# Patient Record
Sex: Male | Born: 1990 | Race: White | Hispanic: No | Marital: Single | Smoking: Current every day smoker
Health system: Southern US, Community
[De-identification: ages and names within clinical notes are randomized; demographics above are authoritative.]

---

## 2017-06-04 ENCOUNTER — Emergency Department (HOSPITAL_COMMUNITY)
Admission: EM | Admit: 2017-06-04 | Discharge: 2017-06-04 | Disposition: A | Discharge status: Home / Self Care | Payer: Self-pay | Attending: Emergency Medicine | Admitting: Emergency Medicine

## 2017-06-04 ENCOUNTER — Other Ambulatory Visit: Payer: Self-pay

## 2017-06-04 ENCOUNTER — Encounter (HOSPITAL_COMMUNITY): Payer: Self-pay | Admitting: Emergency Medicine

## 2017-06-04 DIAGNOSIS — Y999 Unspecified external cause status: Secondary | ICD-10-CM | POA: Insufficient documentation

## 2017-06-04 DIAGNOSIS — W2209XA Striking against other stationary object, initial encounter: Secondary | ICD-10-CM | POA: Insufficient documentation

## 2017-06-04 DIAGNOSIS — S61216A Laceration without foreign body of right little finger without damage to nail, initial encounter: Secondary | ICD-10-CM | POA: Insufficient documentation

## 2017-06-04 DIAGNOSIS — Z23 Encounter for immunization: Secondary | ICD-10-CM | POA: Insufficient documentation

## 2017-06-04 DIAGNOSIS — Y939 Activity, unspecified: Secondary | ICD-10-CM | POA: Insufficient documentation

## 2017-06-04 DIAGNOSIS — Y929 Unspecified place or not applicable: Secondary | ICD-10-CM | POA: Insufficient documentation

## 2017-06-04 MED ORDER — LIDOCAINE HCL (PF) 1 % IJ SOLN
5.0000 mL | Freq: Once | INTRAMUSCULAR | Status: AC
  Administered 2017-06-04: 5 mL via INTRADERMAL
  Filled 2017-06-04: qty 30

## 2017-06-04 MED ORDER — AMOXICILLIN-POT CLAVULANATE 875-125 MG PO TABS: 1.0000 | ORAL_TABLET | Freq: Two times a day (BID) | ORAL | 0 refills | Status: DC

## 2017-06-04 MED ORDER — TETANUS-DIPHTH-ACELL PERTUSSIS 5-2.5-18.5 LF-MCG/0.5 IM SUSP
0.5000 mL | Freq: Once | INTRAMUSCULAR | Status: AC
  Administered 2017-06-04: 0.5 mL via INTRAMUSCULAR
  Filled 2017-06-04: qty 0.5

## 2017-06-04 NOTE — ED Notes (Signed)
Suture cart at bedside 

## 2017-06-04 NOTE — Discharge Instructions (Addendum)
Return for any new or worsening symptoms. If sutures appear infected or are still present after 14 days they may need to be removed.

## 2017-06-04 NOTE — ED Triage Notes (Signed)
Pt presents with laceration to right pinky knuckle post hitting room.

## 2017-06-04 NOTE — ED Provider Notes (Signed)
[facility]-EMERGENCY DEPT Provider Note   CSN: [id number] Arrival date & time: 06/04/17  0919     History   Chief Complaint Chief Complaint  Patient presents with  . Finger Injury    HPI Daniel Farmer is a 27 y.o. male presents the emergency department with chief complaint of laceration.  Patient states that he had a wall this morning and has a laceration over the fifth right MCP joint.  He denies ulcer Acac[place] or potential for "fight bite."  He has no numbness or tingling and full range of motion of the hand.  Patient is unaware of his last tetanus vaccination but states he thinks that he may have had one after he was seen as a trauma at [facility] about 2 years ago.  Review of EMR shows patient did not receive tetanus vaccination and will be updated today.  HPI  History reviewed. No pertinent past medical history.  There are no active problems to display for this patient.   History reviewed. No pertinent surgical history.      Home Medications    Prior to Admission medications   Medication Sig Start Date End Date Taking? Authorizing Provider  amoxicillin-clavulanate (AUGMENTIN) 875-125 MG tablet Take 1 tablet by mouth 2 (two) times daily. One po bid x 7 days 06/04/17   Arthor Captain, PA-C    Family History No family history on file.  Social History Social History   Tobacco Use  . Smoking status: Not on file  Substance Use Topics  . Alcohol use: Not on file  . Drug use: Not on file     Allergies   Patient has no known allergies.   Review of Systems Review of Systems Ten systems reviewed and are negative for acute change, except as noted in the HPI.    Physical Exam Updated Vital Signs BP 121/72 (BP Location: Left Arm)   Pulse (!) 55   Temp 97.7 F (36.5 C) (Oral)   Resp 18   SpO2 100%   Physical Exam  Constitutional: He appears well-developed and well-nourished. No distress.  HENT:  Head: Normocephalic and atraumatic.    Eyes: Pupils are equal, round, and reactive to light. Conjunctivae and EOM are normal. No scleral icterus.  Neck: Normal range of motion. Neck supple.  Cardiovascular: Normal rate, regular rhythm and normal heart sounds.  Pulmonary/Chest: Effort normal and breath sounds normal. No respiratory distress.  Abdominal: Soft. There is no tenderness.  Musculoskeletal: He exhibits no edema.  1 cm laceration over the R 5th MCP   Neurological: He is alert.  Skin: Skin is warm and dry. He is not diaphoretic.  Psychiatric: His behavior is normal.  Nursing note and vitals reviewed.    ED Treatments / Results  Labs (all labs ordered are listed, but only abnormal results are displayed) Labs Reviewed - No data to display  EKG None  Radiology No results found.  Procedures Procedures (including critical care time) LACERATION REPAIR Performed by: Arthor Captain Authorized by: Arthor Captain Consent: Verbal consent obtained. Risks and benefits: risks, benefits and alternatives were discussed Consent given by: patient Patient identity confirmed: provided demographic data Prepped and Draped in normal sterile fashion Wound explored  Laceration Location: Right fifth MCP joint  Laceration Length: 1cm  No Foreign Bodies seen or palpated  Anesthesia: local infiltration  Local anesthetic: lidocaine 1% W/O epinephrine  Anesthetic total: 3 ml  Irrigation method: syringe Amount of cleaning: standard  Skin closure: 5.0 Vicryl  Number of  sutures: 3.0  Technique: Simple interrupted  Patient tolerance: Patient tolerated the procedure well with no immediate complications.  Medications Ordered in ED Medications  lidocaine (PF) (XYLOCAINE) 1 % injection 5 mL (5 mLs Intradermal Given 06/04/17 1053)  Tdap (BOOSTRIX) injection 0.5 mL (0.5 mLs Intramuscular Given 06/04/17 1140)     Initial Impression / Assessment and Plan / ED Course  I have reviewed the triage vital signs and the nursing  notes.  Pertinent labs & imaging results that were available during my care of the patient were reviewed by me and considered in my medical decision making (see chart for details).     Daniel Farmer is a 27 y.o. male who presents to ED for laceration of right hand. Wound thoroughly cleaned in ED today. Wound explored and bottom of wound seen in a bloodless field. Laceration repaired as dictated above. Patient counseled on home wound care.  Patient was urged to return to the Emergency Department for worsening pain, swelling, expanding erythema especially if it streaks away from the affected area, fever, or for any additional concerns. Patient verbalized understanding. All questions answered.   Final Clinical Impressions(s) / ED Diagnoses   Final diagnoses:  Laceration of right little finger without foreign body without damage to nail, initial encounter    ED Discharge Orders        Ordered    amoxicillin-clavulanate (AUGMENTIN) 875-125 MG tablet  2 times daily     06/04/17 1116       Arthor CaptainHarris, Damien Cisar, PA-C 06/04/17 1623    Azalia Bilisampos, Kevin, MD 06/05/17 1312

## 2018-10-22 ENCOUNTER — Emergency Department (HOSPITAL_COMMUNITY): Payer: Self-pay

## 2018-10-22 ENCOUNTER — Other Ambulatory Visit: Payer: Self-pay

## 2018-10-22 ENCOUNTER — Emergency Department (HOSPITAL_COMMUNITY)
Admission: EM | Admit: 2018-10-22 | Discharge: 2018-10-22 | Disposition: A | Discharge status: Home / Self Care | Payer: Self-pay | Attending: Emergency Medicine | Admitting: Emergency Medicine

## 2018-10-22 DIAGNOSIS — Z23 Encounter for immunization: Secondary | ICD-10-CM | POA: Insufficient documentation

## 2018-10-22 DIAGNOSIS — Y9389 Activity, other specified: Secondary | ICD-10-CM | POA: Insufficient documentation

## 2018-10-22 DIAGNOSIS — S61213A Laceration without foreign body of left middle finger without damage to nail, initial encounter: Secondary | ICD-10-CM | POA: Insufficient documentation

## 2018-10-22 DIAGNOSIS — S61412A Laceration without foreign body of left hand, initial encounter: Secondary | ICD-10-CM | POA: Insufficient documentation

## 2018-10-22 DIAGNOSIS — Y990 Civilian activity done for income or pay: Secondary | ICD-10-CM | POA: Insufficient documentation

## 2018-10-22 DIAGNOSIS — S6992XA Unspecified injury of left wrist, hand and finger(s), initial encounter: Secondary | ICD-10-CM

## 2018-10-22 DIAGNOSIS — W230XXA Caught, crushed, jammed, or pinched between moving objects, initial encounter: Secondary | ICD-10-CM | POA: Insufficient documentation

## 2018-10-22 DIAGNOSIS — Y9259 Other trade areas as the place of occurrence of the external cause: Secondary | ICD-10-CM | POA: Insufficient documentation

## 2018-10-22 LAB — COMPREHENSIVE METABOLIC PANEL
ALT: 21 U/L (ref 0–44)
AST: 27 U/L (ref 15–41)
Albumin: 4.4 g/dL (ref 3.5–5.0)
Alkaline Phosphatase: 48 U/L (ref 38–126)
Anion gap: 10 (ref 5–15)
BUN: 13 mg/dL (ref 6–20)
CO2: 26 mmol/L (ref 22–32)
Calcium: 9.7 mg/dL (ref 8.9–10.3)
Chloride: 102 mmol/L (ref 98–111)
Creatinine, Ser: 1.15 mg/dL (ref 0.61–1.24)
GFR calc Af Amer: >60 mL/min (ref >60)
GFR calc non Af Amer: >60 mL/min (ref >60)
Glucose, Bld: 102 mg/dL — ABNORMAL HIGH (ref 70–99)
Potassium: 4.2 mmol/L (ref 3.5–5.1)
Sodium: 138 mmol/L (ref 135–145)
Total Bilirubin: 1 mg/dL (ref 0.3–1.2)
Total Protein: 7.5 g/dL (ref 6.5–8.1)

## 2018-10-22 LAB — HEPATITIS B SURFACE ANTIGEN: Hepatitis B Surface Ag: NONREACTIVE

## 2018-10-22 LAB — HEPATITIS C ANTIBODY: HCV Ab: NONREACTIVE

## 2018-10-22 MED ORDER — ONDANSETRON HCL 4 MG/2ML IJ SOLN
4.0000 mg | Freq: Once | INTRAMUSCULAR | Status: AC
  Administered 2018-10-22: 20:00:00 4 mg via INTRAVENOUS
  Filled 2018-10-22: qty 2

## 2018-10-22 MED ORDER — IBUPROFEN 800 MG PO TABS: 800.0000 mg | ORAL_TABLET | Freq: Three times a day (TID) | ORAL | 0 refills | Status: DC

## 2018-10-22 MED ORDER — MORPHINE SULFATE (PF) 4 MG/ML IV SOLN
4.0000 mg | Freq: Once | INTRAVENOUS | Status: AC
  Administered 2018-10-22: 4 mg via INTRAVENOUS
  Filled 2018-10-22: qty 1

## 2018-10-22 MED ORDER — MORPHINE SULFATE (PF) 4 MG/ML IV SOLN
4.0000 mg | Freq: Once | INTRAVENOUS | Status: AC
  Administered 2018-10-22: 20:00:00 4 mg via INTRAVENOUS
  Filled 2018-10-22: qty 1

## 2018-10-22 MED ORDER — TETANUS-DIPHTH-ACELL PERTUSSIS 5-2.5-18.5 LF-MCG/0.5 IM SUSP
0.5000 mL | Freq: Once | INTRAMUSCULAR | Status: AC
Start: 2018-10-22 — End: 2018-10-22
  Administered 2018-10-22: 16:00:00 0.5 mL via INTRAMUSCULAR
  Filled 2018-10-22: qty 0.5

## 2018-10-22 MED ORDER — LIDOCAINE HCL (PF) 1 % IJ SOLN
5.0000 mL | Freq: Once | INTRAMUSCULAR | Status: AC
  Administered 2018-10-22: 16:00:00 5 mL
  Filled 2018-10-22: qty 5

## 2018-10-22 MED ORDER — OXYCODONE-ACETAMINOPHEN 5-325 MG PO TABS: 2.0000 | ORAL_TABLET | ORAL | 0 refills | Status: DC | PRN

## 2018-10-22 MED ORDER — LIDOCAINE HCL (PF) 1 % IJ SOLN
10.0000 mL | Freq: Once | INTRAMUSCULAR | Status: AC
  Administered 2018-10-22: 10 mL
  Filled 2018-10-22: qty 10

## 2018-10-22 MED ORDER — ONDANSETRON HCL 4 MG/2ML IJ SOLN
4.0000 mg | Freq: Once | INTRAMUSCULAR | Status: AC
  Administered 2018-10-22: 4 mg via INTRAVENOUS
  Filled 2018-10-22: qty 2

## 2018-10-22 NOTE — ED Provider Notes (Signed)
[facility] EMERGENCY DEPARTMENT Provider Note   CSN: [id number] Arrival date & time: 10/22/18  1559     History   Chief Complaint Chief Complaint  Patient presents with   Hand Injury    HPI Daniel Farmer is a 28 y.o. 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  He complains of significant pain and nausea.  Patient is able to move his fingers and he denies any and all other symptoms.  He is unsure of his tetanus status.       HPI  No past medical history on file.  There are no active problems to display for this patient.   No past surgical history on file.      Home Medications    Prior to Admission medications   Medication Sig Start Date End Date Taking? Authorizing Provider  ibuprofen (ADVIL) 800 MG tablet Take 1 tablet (800 mg total) by mouth 3 (three) times daily. 10/22/18   Corena Herter, PA-C  oxyCODONE-acetaminophen (PERCOCET/ROXICET) 5-325 MG tablet Take 2 tablets by mouth every 4 (four) hours as needed for severe pain. 10/22/18   Corena Herter, PA-C    Family History No family history on file.  Social History Social History   Tobacco Use   Smoking status: Not on file  Substance Use Topics   Alcohol use: Not on file   Drug use: Not on file     Allergies   Patient has no known allergies.   Review of Systems Review of Systems  All other systems reviewed and are negative.    Physical Exam Updated Vital Signs BP 130/70    Pulse (!) 57    Resp 12    Ht 6' (1.829 m)    Wt 79.4 kg    SpO2 100%    BMI 23.73 kg/m   Physical Exam Vitals signs and nursing note reviewed. Exam conducted with a chaperone present.  Constitutional:      Appearance: Normal appearance.  HENT:     Head: Normocephalic and atraumatic.  Eyes:     General: No scleral icterus.    Conjunctiva/sclera: Conjunctivae  normal.  Pulmonary:     Effort: Pulmonary effort is normal.  Musculoskeletal:     Comments: Left hand: 4 cm jagged laceration to palm.  Hemostasis achieved with compression.  V shaped laceration palmar aspect third finger immediately distal to MCP joint.  Radial pulse intact.  Cap refill intact.  Patient is able to extend and flex fingers, albeit third finger is limited due to pain and inflammation.  Sensation intact throughout. Left wrist: Range of motion, strength, and sensation intact.  Skin:    General: Skin is dry.  Neurological:     Mental Status: He is alert.     GCS: GCS eye subscore is 4. GCS verbal subscore is 5. GCS motor subscore is 6.  Psychiatric:        Mood and Affect: Mood normal.        Behavior: Behavior normal.        Thought Content: Thought content normal.      ED Treatments / Results  Labs (all labs ordered are listed, but only abnormal results are displayed) Labs Reviewed  RAPID HIV SCREEN (HIV 1/2 AB+AG)  COMPREHENSIVE METABOLIC PANEL  HEPATITIS C ANTIBODY  HEPATITIS B SURFACE ANTIGEN  RPR    EKG  None  Radiology Dg Hand Complete Left  Result Date: 10/22/2018 CLINICAL DATA:  LEFT hand crushed between a biopsy CT and a dump truck, lacerations to palm, LEFT hand pain running up ulnar dorsal sides across PIP joints into index through little fingers EXAM: LEFT HAND - COMPLETE 3+ VIEW COMPARISON:  None FINDINGS: Osseous mineralization normal. Joint spaces preserved. Questionable nondisplaced fracture transversely at head of third metacarpal versus superimposed soft tissue artifact. Multiple small radiopacities project at the soft tissues palmar soft tissues at the level of the third metacarpal head. Laceration at the middle finger. Subtle lucency at base of distal phalanx LEFT index finger, cannot exclude nondisplaced intra-articular fracture. No additional fracture, dislocation, or bone destruction. IMPRESSION: Question nondisplaced intra-articular fracture at  base of distal phalanx LEFT index finger. Transverse nondisplaced fracture versus artifact at the third metacarpal head. Radiopaque foreign bodies at volar soft tissues at the level of the distal third metacarpal. Electronically Signed   By: Ulyses SouthwardMark  Boles M.D.   On: 10/22/2018 17:13    Procedures .Marland Kitchen.Laceration Repair  Date/Time: 10/22/2018 7:02 PM Performed by: Lorelee NewGreen, Lisl Slingerland L, PA-C Authorized by: Lorelee NewGreen, Shawnya Mayor L, PA-C   Consent:    Consent obtained:  Verbal   Consent given by:  Patient   Risks discussed:  Infection, pain, retained foreign body, vascular damage, tendon damage, poor cosmetic result, poor wound healing, nerve damage and need for additional repair   Alternatives discussed:  No treatment Anesthesia (see MAR for exact dosages):    Anesthesia method:  Local infiltration   Local anesthetic:  Lidocaine 1% w/o epi Laceration details:    Location:  Hand   Hand location:  L palm   Length (cm):  3 Repair type:    Repair type:  Simple Pre-procedure details:    Preparation:  Patient was prepped and draped in usual sterile fashion and imaging obtained to evaluate for foreign bodies Treatment:    Area cleansed with:  Saline and soap and water   Amount of cleaning:  Extensive   Irrigation solution:  Sterile saline   Irrigation volume:  500 mL   Irrigation method:  Pressure wash   Visualized foreign bodies/material removed: no   Skin repair:    Repair method:  Sutures   Suture size:  4-0   Suture material:  Prolene   Suture technique:  Simple interrupted   Number of sutures:  4 Post-procedure details:    Dressing:  Antibiotic ointment and sterile dressing   Patient tolerance of procedure:  Tolerated well, no immediate complications .Marland Kitchen.Laceration Repair  Date/Time: 10/22/2018 7:04 PM Performed by: Lorelee NewGreen, Nikeria Kalman L, PA-C Authorized by: Lorelee NewGreen, Aveah Castell L, PA-C   Consent:    Consent obtained:  Verbal   Consent given by:  Patient   Risks discussed:  Infection, need for  additional repair, nerve damage, poor wound healing, poor cosmetic result, pain, retained foreign body, tendon damage and vascular damage   Alternatives discussed:  No treatment Anesthesia (see MAR for exact dosages):    Anesthesia method:  Local infiltration   Local anesthetic:  Lidocaine 1% w/o epi Laceration details:    Location:  Finger   Finger location:  L long finger   Length (cm):  4 Repair type:    Repair type:  Simple Pre-procedure details:    Preparation:  Patient was prepped and draped in usual sterile fashion Exploration:    Hemostasis achieved with:  Direct pressure Treatment:    Area cleansed with:  Saline and soap and water  Amount of cleaning:  Extensive   Irrigation solution:  Sterile saline   Irrigation volume:  500 mL   Irrigation method:  Pressure wash   Visualized foreign bodies/material removed: no   Skin repair:    Repair method:  Sutures   Suture size:  4-0   Suture material:  Prolene   Number of sutures:  5 Approximation:    Approximation:  Loose Post-procedure details:    Dressing:  Sterile dressing and antibiotic ointment   Patient tolerance of procedure:  Tolerated well, no immediate complications   (including critical care time)  Medications Ordered in ED Medications  morphine 4 MG/ML injection 4 mg (4 mg Intravenous Given 10/22/18 1626)  ondansetron (ZOFRAN) injection 4 mg (4 mg Intravenous Given 10/22/18 1626)  Tdap (BOOSTRIX) injection 0.5 mL (0.5 mLs Intramuscular Given 10/22/18 1624)  lidocaine (PF) (XYLOCAINE) 1 % injection 5 mL (5 mLs Infiltration Given 10/22/18 1626)  lidocaine (PF) (XYLOCAINE) 1 % injection 10 mL (10 mLs Infiltration Given by Other 10/22/18 1934)  morphine 4 MG/ML injection 4 mg (4 mg Intravenous Given 10/22/18 1956)  ondansetron (ZOFRAN) injection 4 mg (4 mg Intravenous Given 10/22/18 1956)     Initial Impression / Assessment and Plan / ED Course  I have reviewed the triage vital signs and the nursing  notes.  Pertinent labs & imaging results that were available during my care of the patient were reviewed by me and considered in my medical decision making (see chart for details).        DG complete left hand is reviewed and demonstrates questionable nondisplaced intra-articular fracture at the base of the distal phalanx on left index finger.  Also shows a transverse, nondisplaced fracture at the third metacarpal head.  There is evidence of small radiopaque foreign bodies in wound of third phalanx.  Repaired palm and finger using 4-0 Vicryl.  Patient was provided morphine for pain relief and Zofran for his nausea.  He received Boostrix injection given uncertain tetanus status.  Advised patient to return to the ED or seek medical attention for any new or worsening pain, sensation loss, worsening erythema and swelling, fevers, abnormal discharge, or any other new or worsening symptoms.  All of the evaluation and work-up results were discussed with the patient and any family at bedside. They were provided opportunity to ask any additional questions and have none at this time. They have expressed understanding of verbal discharge instructions as well as return precautions and are agreeable to the plan.    Final Clinical Impressions(s) / ED Diagnoses   Final diagnoses:  Hand injury, left, initial encounter    ED Discharge Orders         Ordered    oxyCODONE-acetaminophen (PERCOCET/ROXICET) 5-325 MG tablet  Every 4 hours PRN     10/22/18 1951    ibuprofen (ADVIL) 800 MG tablet  3 times daily     10/22/18 1951           Elvera Maria 10/22/18 2041    Alvira Monday, MD 10/24/18 1328

## 2018-10-22 NOTE — Progress Notes (Signed)
Orthopedic Tech Progress Note Patient Details:  Kwabena Strutz Mar 29, 1990 [id number]  Ortho Devices Type of Ortho Device: Rad Gutter splint Ortho Device/Splint Interventions: Application, Ordered   Post Interventions Patient Tolerated: Well Instructions Provided: Care of device   Melony Overly T 10/22/2018, 8:48 PM

## 2018-10-22 NOTE — Discharge Instructions (Addendum)
Please go to your scheduled appointment Dr. Apolonio Schneiders at [facility] tomorrow at 3:30 PM for ongoing evaluation and management.  Please read the attachment on wound care.  Please take your medications, as prescribed.  Only take the oxycodone for severe pain symptoms.   Return to ED or seek medical attention should you develop any worsening swelling, redness, diminished movement, diminished sensation, fevers, chills, or any other new or worsening symptoms.  You were given narcotic and or sedative medications while in the emergency department. Do not drive. Do not use machinery or power tools. Do not sign legal documents. Do not drink alcohol. Do not take sleeping pills. Do not supervise children by yourself. Do not participate in activities that require climbing or being in high places.

## 2018-10-22 NOTE — ED Triage Notes (Signed)
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 Pt stuck hand out and caught it between bobcat and dump truck. Pt able to move and feel finger. Radial pulse present.

## 2018-10-23 LAB — RPR: RPR Ser Ql: NONREACTIVE

## 2018-10-23 LAB — RAPID HIV SCREEN (HIV 1/2 AB+AG)
HIV 1/2 Antibodies: NONREACTIVE
HIV-1 P24 Antigen - HIV24: NONREACTIVE

## 2018-10-25 ENCOUNTER — Emergency Department (HOSPITAL_COMMUNITY)
Admission: EM | Admit: 2018-10-25 | Discharge: 2018-10-26 | Disposition: A | Discharge status: Home / Self Care | Payer: Self-pay | Attending: Emergency Medicine | Admitting: Emergency Medicine

## 2018-10-25 ENCOUNTER — Encounter (HOSPITAL_COMMUNITY): Payer: Self-pay | Admitting: Emergency Medicine

## 2018-10-25 ENCOUNTER — Other Ambulatory Visit: Payer: Self-pay

## 2018-10-25 DIAGNOSIS — Z5321 Procedure and treatment not carried out due to patient leaving prior to being seen by health care provider: Secondary | ICD-10-CM | POA: Insufficient documentation

## 2018-10-25 LAB — CBC WITH DIFFERENTIAL/PLATELET
Abs Immature Granulocytes: 0.01 10*3/uL (ref 0.00–0.07)
Basophils Absolute: 0 10*3/uL (ref 0.0–0.1)
Basophils Relative: 0 %
Eosinophils Absolute: 0.2 10*3/uL (ref 0.0–0.5)
Eosinophils Relative: 3 %
HCT: 38.9 % — ABNORMAL LOW (ref 39.0–52.0)
Hemoglobin: 13 g/dL (ref 13.0–17.0)
Immature Granulocytes: 0 %
Lymphocytes Relative: 34 %
Lymphs Abs: 2.4 10*3/uL (ref 0.7–4.0)
MCH: 29.5 pg (ref 26.0–34.0)
MCHC: 33.4 g/dL (ref 30.0–36.0)
MCV: 88.4 fL (ref 80.0–100.0)
Monocytes Absolute: 0.7 10*3/uL (ref 0.1–1.0)
Monocytes Relative: 10 %
Neutro Abs: 3.7 10*3/uL (ref 1.7–7.7)
Neutrophils Relative %: 53 %
Platelets: 163 10*3/uL (ref 150–400)
RBC: 4.4 MIL/uL (ref 4.22–5.81)
RDW: 12.1 % (ref 11.5–15.5)
WBC: 7.2 10*3/uL (ref 4.0–10.5)
nRBC: 0 % (ref 0.0–0.2)

## 2018-10-25 MED ORDER — SODIUM CHLORIDE 0.9% FLUSH: 3.0000 mL | Freq: Once | INTRAVENOUS | Status: DC

## 2018-10-25 NOTE — ED Triage Notes (Signed)
Pt c/o infection to left hand following a hand injury and stitches. Denies fevers, states his hand has a foul odor. Requesting antibiotics.

## 2018-10-26 LAB — COMPREHENSIVE METABOLIC PANEL
ALT: 16 U/L (ref 0–44)
AST: 19 U/L (ref 15–41)
Albumin: 3.7 g/dL (ref 3.5–5.0)
Alkaline Phosphatase: 35 U/L — ABNORMAL LOW (ref 38–126)
Anion gap: 6 (ref 5–15)
BUN: 9 mg/dL (ref 6–20)
CO2: 26 mmol/L (ref 22–32)
Calcium: 9.3 mg/dL (ref 8.9–10.3)
Chloride: 107 mmol/L (ref 98–111)
Creatinine, Ser: 0.94 mg/dL (ref 0.61–1.24)
GFR calc Af Amer: >60 mL/min (ref >60)
GFR calc non Af Amer: >60 mL/min (ref >60)
Glucose, Bld: 89 mg/dL (ref 70–99)
Potassium: 5.2 mmol/L — ABNORMAL HIGH (ref 3.5–5.1)
Sodium: 139 mmol/L (ref 135–145)
Total Bilirubin: 0.9 mg/dL (ref 0.3–1.2)
Total Protein: 6.3 g/dL — ABNORMAL LOW (ref 6.5–8.1)

## 2018-10-26 LAB — LACTIC ACID, PLASMA: Lactic Acid, Venous: 0.6 mmol/L (ref 0.5–1.9)

## 2018-10-26 NOTE — ED Notes (Signed)
Advised Patient to stay, Patient became upset and left.  Patient stated, " I will come back in the morning when it will be less busy."

## 2021-04-05 ENCOUNTER — Other Ambulatory Visit: Payer: Self-pay

## 2021-04-05 ENCOUNTER — Emergency Department (HOSPITAL_COMMUNITY)
Admission: EM | Admit: 2021-04-05 | Discharge: 2021-04-05 | Disposition: A | Discharge status: Home / Self Care | Payer: Self-pay | Attending: Emergency Medicine | Admitting: Emergency Medicine

## 2021-04-05 ENCOUNTER — Emergency Department (HOSPITAL_COMMUNITY): Payer: Self-pay

## 2021-04-05 DIAGNOSIS — S61314A Laceration without foreign body of right ring finger with damage to nail, initial encounter: Secondary | ICD-10-CM | POA: Insufficient documentation

## 2021-04-05 DIAGNOSIS — S61312A Laceration without foreign body of right middle finger with damage to nail, initial encounter: Secondary | ICD-10-CM | POA: Insufficient documentation

## 2021-04-05 DIAGNOSIS — Z23 Encounter for immunization: Secondary | ICD-10-CM | POA: Insufficient documentation

## 2021-04-05 DIAGNOSIS — X500XXA Overexertion from strenuous movement or load, initial encounter: Secondary | ICD-10-CM | POA: Insufficient documentation

## 2021-04-05 DIAGNOSIS — S61310A Laceration without foreign body of right index finger with damage to nail, initial encounter: Secondary | ICD-10-CM | POA: Insufficient documentation

## 2021-04-05 DIAGNOSIS — S61319A Laceration without foreign body of unspecified finger with damage to nail, initial encounter: Secondary | ICD-10-CM

## 2021-04-05 DIAGNOSIS — Y92812 Truck as the place of occurrence of the external cause: Secondary | ICD-10-CM | POA: Insufficient documentation

## 2021-04-05 LAB — CBC
HCT: 40.9 % (ref 39.0–52.0)
Hemoglobin: 13.9 g/dL (ref 13.0–17.0)
MCH: 29.9 pg (ref 26.0–34.0)
MCHC: 34 g/dL (ref 30.0–36.0)
MCV: 88 fL (ref 80.0–100.0)
Platelets: 195 10*3/uL (ref 150–400)
RBC: 4.65 MIL/uL (ref 4.22–5.81)
RDW: 12.7 % (ref 11.5–15.5)
WBC: 7.6 10*3/uL (ref 4.0–10.5)
nRBC: 0 % (ref 0.0–0.2)

## 2021-04-05 LAB — BASIC METABOLIC PANEL
Anion gap: 7 (ref 5–15)
BUN: 14 mg/dL (ref 6–20)
CO2: 23 mmol/L (ref 22–32)
Calcium: 9.1 mg/dL (ref 8.9–10.3)
Chloride: 107 mmol/L (ref 98–111)
Creatinine, Ser: 1.01 mg/dL (ref 0.61–1.24)
GFR, Estimated: >60 mL/min (ref >60)
Glucose, Bld: 88 mg/dL (ref 70–99)
Potassium: 4.2 mmol/L (ref 3.5–5.1)
Sodium: 137 mmol/L (ref 135–145)

## 2021-04-05 MED ORDER — TRAMADOL HCL 50 MG PO TABS: 50.0000 mg | ORAL_TABLET | Freq: Three times a day (TID) | ORAL | 0 refills | Status: AC | PRN

## 2021-04-05 MED ORDER — HYDROMORPHONE HCL 1 MG/ML IJ SOLN
1.0000 mg | Freq: Once | INTRAMUSCULAR | Status: AC
  Administered 2021-04-05: 1 mg via INTRAVENOUS
  Filled 2021-04-05: qty 1

## 2021-04-05 MED ORDER — CEPHALEXIN 250 MG PO CAPS
500.0000 mg | ORAL_CAPSULE | Freq: Once | ORAL | Status: AC
  Administered 2021-04-05: 500 mg via ORAL
  Filled 2021-04-05: qty 2

## 2021-04-05 MED ORDER — ACETAMINOPHEN 325 MG PO TABS: 650.0000 mg | ORAL_TABLET | Freq: Four times a day (QID) | ORAL | 0 refills | Status: AC | PRN

## 2021-04-05 MED ORDER — IBUPROFEN 600 MG PO TABS: 600.0000 mg | ORAL_TABLET | Freq: Four times a day (QID) | ORAL | 0 refills | Status: DC | PRN

## 2021-04-05 MED ORDER — SODIUM CHLORIDE 0.9 % IV BOLUS
1000.0000 mL | Freq: Once | INTRAVENOUS | Status: AC
  Administered 2021-04-05: 1000 mL via INTRAVENOUS

## 2021-04-05 MED ORDER — TETANUS-DIPHTH-ACELL PERTUSSIS 5-2.5-18.5 LF-MCG/0.5 IM SUSY
0.5000 mL | PREFILLED_SYRINGE | Freq: Once | INTRAMUSCULAR | Status: AC
  Administered 2021-04-05: 0.5 mL via INTRAMUSCULAR
  Filled 2021-04-05: qty 0.5

## 2021-04-05 MED ORDER — CEPHALEXIN 500 MG PO CAPS: 500.0000 mg | ORAL_CAPSULE | Freq: Two times a day (BID) | ORAL | 0 refills | Status: AC

## 2021-04-05 MED ORDER — LIDOCAINE HCL (PF) 1 % IJ SOLN
30.0000 mL | Freq: Once | INTRAMUSCULAR | Status: AC
Start: 2021-04-05 — End: 2021-04-05
  Administered 2021-04-05: 30 mL via INTRADERMAL
  Filled 2021-04-05: qty 30

## 2021-04-05 NOTE — Discharge Instructions (Addendum)
?  You will need to follow-up with a hand surgeon due to the depth of the cuts on your fingers, especially the second finger, where there may be some injury to the tendon. ? ?In the meantime, you will take antibiotics at home as prescribed for a full week to prevent infection.  Keep your wound dry for the next 48 hours, afterwards you can take down the bandages and wash gently with regular soap and water.  Do not put peroxide, ointment or cream over the wounds. ? ?You will need to have your stitches removed in 7 to 10 days.  You can have this done at an urgent care, your doctors office, the surgeons office, or the ER. (April 12-14th). ? ?For pain at home, you can start by taking ibuprofen and Tylenol together as needed every 6 hours.  These were prescribed.  If the pain is more severe, especially at night, you can also take a tramadol, which is a narcotic.  Do not drive after taking tramadol.  Narcotics can lead to constipation, and consider using these with a stool softener.  Try to limit your use of tramadol if you are able. Long-term use of narcotics has been known to lead to addiction and dependency problems. ?

## 2021-04-05 NOTE — ED Provider Notes (Signed)
..Laceration Repair ? ?Date/Time: 04/05/2021 2:00 PM ?Performed by: Gailen Shelter, PA ?Authorized by: Gailen Shelter, PA  ? ?Consent:  ?  Consent obtained:  Verbal ?  Consent given by:  Patient ?  Risks discussed:  Infection, need for additional repair, pain, poor cosmetic result and poor wound healing ?  Alternatives discussed:  No treatment and delayed treatment ?Universal protocol:  ?  Procedure explained and questions answered to patient or proxy's satisfaction: yes   ?  Relevant documents present and verified: yes   ?  Test results available: yes   ?  Imaging studies available: yes   ?  Required blood products, implants, devices, and special equipment available: yes   ?  Site/side marked: yes   ?  Immediately prior to procedure, a time out was called: yes   ?  Patient identity confirmed:  Verbally with patient ?Anesthesia:  ?  Anesthesia method:  Local infiltration ?Laceration details:  ?  Location:  Finger ?  Finger location:  R index finger (At dorsal MCP) ?  Length (cm):  2.5 ?Exploration:  ?  Hemostasis achieved with:  Direct pressure ?  Imaging obtained: x-ray   ?  Imaging outcome: foreign body not noted   ?  Wound exploration: wound explored through full range of motion   ?  Wound extent: tendon damage   ?  Wound extent: no foreign bodies/material noted   ?  Tendon damage location:  Upper extremity ?  Upper extremity tendon damage location:  Finger extensor ?  Contaminated: no   ?Treatment:  ?  Area cleansed with:  Saline ?  Amount of cleaning:  Standard ?  Irrigation solution:  Sterile saline and tap water ?  Irrigation volume:  Copious ?  Irrigation method:  Pressure wash ?  Visualized foreign bodies/material removed: no   ?Skin repair:  ?  Repair method:  Sutures ?  Suture size:  4-0 ?  Suture material:  Prolene ?  Suture technique:  Simple interrupted ?  Number of sutures:  3 ?Approximation:  ?  Approximation:  Close ?Repair type:  ?  Repair type:  Complex ?Post-procedure details:  ?  Dressing:   Antibiotic ointment and non-adherent dressing ?  Procedure completion:  Tolerated well, no immediate complications ?Marland Kitchen.Laceration Repair ? ?Date/Time: 04/05/2021 2:02 PM ?Performed by: Gailen Shelter, PA ?Authorized by: Gailen Shelter, PA  ? ?Consent:  ?  Consent obtained:  Verbal ?  Consent given by:  Patient ?  Risks discussed:  Infection, need for additional repair, pain, poor cosmetic result and poor wound healing ?  Alternatives discussed:  No treatment and delayed treatment ?Universal protocol:  ?  Procedure explained and questions answered to patient or proxy's satisfaction: yes   ?  Relevant documents present and verified: yes   ?  Test results available: yes   ?  Imaging studies available: yes   ?  Required blood products, implants, devices, and special equipment available: yes   ?  Site/side marked: yes   ?  Immediately prior to procedure, a time out was called: yes   ?  Patient identity confirmed:  Verbally with patient ?Anesthesia:  ?  Anesthesia method:  Local infiltration ?Laceration details:  ?  Location:  Finger ?  Finger location:  R long finger ?  Length (cm):  1 ?Exploration:  ?  Hemostasis achieved with:  Direct pressure ?  Imaging obtained: x-ray   ?  Imaging outcome: foreign body not noted   ?  Wound extent: no foreign bodies/material noted and no tendon damage noted   ?  Contaminated: no   ?Treatment:  ?  Area cleansed with:  Saline ?  Amount of cleaning:  Standard ?  Irrigation solution:  Tap water ?  Irrigation volume:  Copious ?  Irrigation method:  Pressure wash ?  Visualized foreign bodies/material removed: no   ?Skin repair:  ?  Repair method:  Sutures ?  Suture size:  4-0 ?  Suture technique:  Simple interrupted ?  Number of sutures:  1 ?Approximation:  ?  Approximation:  Close ?Repair type:  ?  Repair type:  Simple ?Post-procedure details:  ?  Dressing:  Antibiotic ointment and non-adherent dressing ?  Procedure completion:  Tolerated well, no immediate complications ?Marland Kitchen.Laceration  Repair ? ?Date/Time: 04/05/2021 2:03 PM ?Performed by: Gailen Shelter, PA ?Authorized by: Gailen Shelter, PA  ? ?Consent:  ?  Consent obtained:  Verbal ?  Consent given by:  Patient ?  Risks discussed:  Infection, need for additional repair, pain, poor cosmetic result and poor wound healing ?  Alternatives discussed:  No treatment and delayed treatment ?Universal protocol:  ?  Procedure explained and questions answered to patient or proxy's satisfaction: yes   ?  Relevant documents present and verified: yes   ?  Test results available: yes   ?  Imaging studies available: yes   ?  Required blood products, implants, devices, and special equipment available: yes   ?  Site/side marked: yes   ?  Immediately prior to procedure, a time out was called: yes   ?  Patient identity confirmed:  Verbally with patient ?Anesthesia:  ?  Anesthesia method:  Local infiltration ?Laceration details:  ?  Location:  Finger ?  Finger location:  R ring finger ?  Length (cm):  3 ?Exploration:  ?  Hemostasis achieved with:  Direct pressure ?  Wound extent: no foreign bodies/material noted and no tendon damage noted   ?  Contaminated: no   ?Treatment:  ?  Area cleansed with:  Saline ?  Amount of cleaning:  Standard ?  Irrigation solution:  Sterile saline and tap water ?  Irrigation volume:  Copious ?  Irrigation method:  Pressure wash ?  Visualized foreign bodies/material removed: no   ?Skin repair:  ?  Repair method:  Sutures ?  Suture size:  4-0 ?  Suture material:  Prolene ?  Suture technique:  Simple interrupted ?  Number of sutures:  3 ?Approximation:  ?  Approximation:  Close ?Repair type:  ?  Repair type:  Complex ?Post-procedure details:  ?  Dressing:  Antibiotic ointment and non-adherent dressing ?  Procedure completion:  Tolerated well, no immediate complications  ? ? ? ?  ?Gailen Shelter, [place] ?04/05/21 1404 ? ?  ?Terald Sleeper, MD ?04/05/21 1520 ? ?

## 2021-04-05 NOTE — ED Provider Notes (Signed)
?[facility] ?Provider Note ? ? ?CSN: [id number] ?Arrival date & time: 04/05/21  1000 ? ?  ? ?History ? ?Chief Complaint  ?Patient presents with  ? Hand Injury  ? ? ?Daniel Farmer is a 31 y.o. male presenting to the emergency department injury to his right hand.  Denies any other medical problems.  He reports that he was unloading metal scrap and junk from a truck today, and he lacerated his second third and fourth fingers on a sharp piece of metal.  He did have significant bleeding on scene, wrapped his hand in his chart and also placed a tourniquet with a belt around his wrist, for approximately 30 minutes in total time prior to my evaluation in the room.  He is right-handed.  He does not know his last tetanus shot ? ?HPI ? ?  ? ?Home Medications ?Prior to Admission medications   ?Medication Sig Start Date End Date Taking? Authorizing Provider  ?acetaminophen (TYLENOL) 325 MG tablet Take 2 tablets (650 mg total) by mouth every 6 (six) hours as needed for up to 30 doses. 04/05/21  Yes Navaya Wiatrek, Kermit BaloMatthew J, MD  ?cephALEXin (KEFLEX) 500 MG capsule Take 1 capsule (500 mg total) by mouth 2 (two) times daily for 7 days. 04/05/21 04/12/21 Yes Shanese Riemenschneider, Kermit BaloMatthew J, MD  ?ibuprofen (ADVIL) 600 MG tablet Take 1 tablet (600 mg total) by mouth every 6 (six) hours as needed for up to 30 doses. 04/05/21  Yes Farhana Fellows, Kermit BaloMatthew J, MD  ?traMADol (ULTRAM) 50 MG tablet Take 1 tablet (50 mg total) by mouth every 8 (eight) hours as needed for up to 10 doses. 04/05/21  Yes Bell Cai, Kermit BaloMatthew J, MD  ?   ? ?Allergies    ?Patient has no known allergies.   ? ?Review of Systems   ?Review of Systems ? ?Physical Exam ?Updated Vital Signs ?BP 101/68 (BP Location: Right Arm)   Pulse (!) 59   Temp 98 ?F (36.7 ?C) (Oral)   Resp 15   Ht 6' (1.829 m)   Wt 68 kg   SpO2 99%   BMI 20.34 kg/m?  ?Physical Exam ?Constitutional:   ?   General: He is not in acute distress. ?HENT:  ?   Head: Normocephalic and atraumatic.  ?Eyes:  ?    Conjunctiva/sclera: Conjunctivae normal.  ?   Pupils: Pupils are equal, round, and reactive to light.  ?Cardiovascular:  ?   Rate and Rhythm: Normal rate and regular rhythm.  ?Pulmonary:  ?   Effort: Pulmonary effort is normal. No respiratory distress.  ?Abdominal:  ?   General: There is no distension.  ?   Tenderness: There is no abdominal tenderness.  ?Musculoskeletal:  ?   Comments: Laceration through the right second, third, fourth digits, in a linear pattern on the dorsum of the hand, just distal to the PIP joint ?Patient is able to fully flex and extend all fingers to make a fist and open hand ?Normal cap refills, no active bleeding from wound  ?Skin: ?   General: Skin is warm and dry.  ?Neurological:  ?   General: No focal deficit present.  ?   Mental Status: He is alert. Mental status is at baseline.  ?Psychiatric:     ?   Mood and Affect: Mood normal.     ?   Behavior: Behavior normal.  ? ? ?ED Results / Procedures / Treatments   ?Labs ?(all labs ordered are listed, but only abnormal results are displayed) ?  Labs Reviewed  ?BASIC METABOLIC PANEL  ?CBC  ? ? ?EKG ?None ? ?Radiology ?DG Hand 2 View Right ? ?Result Date: 04/05/2021 ?CLINICAL DATA:  Laceration, concern for foreign body EXAM: RIGHT HAND - 2 VIEW COMPARISON:  None. FINDINGS: No acute fracture or dislocation. The joint spaces are preserved. Alignment is unremarkable. No radiopaque foreign body. IMPRESSION: No acute osseous abnormality.  No radiopaque foreign body. Electronically Signed   By: Wiliam Ke M.D.   On: 04/05/2021 11:11   ? ?Procedures ?Procedures  ? ? ?Medications Ordered in ED ?Medications  ?sodium chloride 0.9 % bolus 1,000 mL (0 mLs Intravenous Stopped 04/05/21 1212)  ?cephALEXin (KEFLEX) capsule 500 mg (500 mg Oral Given 04/05/21 1041)  ?Tdap (BOOSTRIX) injection 0.5 mL (0.5 mLs Intramuscular Given 04/05/21 1040)  ?HYDROmorphone (DILAUDID) injection 1 mg (1 mg Intravenous Given 04/05/21 1039)  ?lidocaine (PF) (XYLOCAINE) 1 % injection 30 mL  (30 mLs Intradermal Given by Other 04/05/21 1212)  ? ? ?ED Course/ Medical Decision Making/ A&P ?Clinical Course as of 04/05/21 1521  ?Wed Apr 05, 2021  ?1130 Pain is improved, bleeding remains under control.  CBC and BMP are unremarkable.  X-ray per my review does not show any evidence of acute underlying fracture or retained foreign bodies.  We will perform local and/or digital block and suture wounds) [MT]  ?1242 Lacerations repaired - will discharge with follow up instructions; antibiotics [MT]  ?1305 3x 4-0 in 4th finger ?1 4-0 in 3rd finger ?3 4-0 2nd MCP  [WF]  ?  ?Clinical Course User Index ?[MT] Julane Crock, Kermit Balo, MD ?[WF] Gailen Shelter, [place]  ? ?                        ?Medical Decision Making ?Amount and/or Complexity of Data Reviewed ?Labs: ordered. ?Radiology: ordered. ? ?Risk ?OTC drugs. ?Prescription drug management. ? ? ?The patient is here with a laceration to his right hand.  He appears neurovascularly intact on his initial presentation.  Tourniquet was immediately taken down, with only 30 minutes of time.  He has some paresthesias but gross sensation is intact in his entire hand.  He is able to fully flex and extend his fingers, to lowers my suspicion for a tendon rupture.  We will obtain an x-ray to evaluate for underlying fracture or retained foreign body.  I was able to irrigate the wound and explored to length.  You would begin to feel lightheaded and hot as I was examining the wound, which may be a vagal response, as he has had syncope in the past distress and pain.  However I do think is reasonable to check of his hemoglobin count and give him a liter of fluid, given his report of large blood loss at home.  There is no active bleeding at this time. ? ?Patient's friend is present at the bedside with his permission.  We will also update the patient's tetanus and provide him antibiotics. ? ?X-rays reviewed and no underlying fracture.  Wounds were irrigated and repaired.  Upon further examination  of the injury to the second extensor tendon, I do believe there is at least a partial tear of the extensor tendon; however the patient is able to fully extend the finger, which is reassuring.  We will still irrigate, clean the wound, suture closed, may consider a splint of some sort to keep his finger in extension, and I would advise follow-up for reassessment in the office with a hand surgeon.  Patient verbalized understanding and agreement ? ?On my reassessment his pain had improved with the Dilaudid, blood pressure remained stable. ? ? ? ? ? ? ? ?Final Clinical Impression(s) / ED Diagnoses ?Final diagnoses:  ?Laceration of finger of right hand without foreign body with damage to nail, unspecified finger, initial encounter  ? ? ?Rx / DC Orders ?ED Discharge Orders   ? ?      Ordered  ?  cephALEXin (KEFLEX) 500 MG capsule  2 times daily       ? 04/05/21 1257  ?  ibuprofen (ADVIL) 600 MG tablet  Every 6 hours PRN       ? 04/05/21 1257  ?  acetaminophen (TYLENOL) 325 MG tablet  Every 6 hours PRN       ? 04/05/21 1257  ?  traMADol (ULTRAM) 50 MG tablet  Every 8 hours PRN       ? 04/05/21 1257  ? ?  ?  ? ?  ? ? ?  ?Terald Sleeper, MD ?04/05/21 1521 ? ?

## 2021-04-05 NOTE — ED Triage Notes (Signed)
Pt was loading materials to his truck when a piece of metal lacerated right hand. 2nd, 3rd and fourth finger laceration noted.  ?

## 2021-04-14 ENCOUNTER — Encounter: Payer: Self-pay | Admitting: Orthopedic Surgery

## 2021-04-14 ENCOUNTER — Ambulatory Visit (INDEPENDENT_AMBULATORY_CARE_PROVIDER_SITE_OTHER): Payer: Self-pay | Admitting: Orthopedic Surgery

## 2021-04-14 DIAGNOSIS — S61411A Laceration without foreign body of right hand, initial encounter: Secondary | ICD-10-CM

## 2021-04-14 DIAGNOSIS — S61219A Laceration without foreign body of unspecified finger without damage to nail, initial encounter: Secondary | ICD-10-CM

## 2021-04-14 NOTE — Progress Notes (Signed)
? ?  Office Visit Note ?  ?Patient: Daniel Farmer           ?Date of Birth: [birth year]           ?MRN: [id number] ?Visit Date: 04/14/2021 ?             ?Requested by: No referring provider defined for this encounter. ?PCP: Pcp, No ? ? ?Assessment & Plan: ?Visit Diagnoses:  ?1. Laceration of multiple sites of right hand and fingers, initial encounter   ? ? ?Plan: Patient has lacerations across the dorsal aspect of the index middle and ring fingers around the MP joints.  He has full extension of the fingers with 5 out of 5 extensor strength.  The incisions are well approximated without surrounding erythema or induration.  Sutures were removed.  He can follow-up with me as needed. ? ?Follow-Up Instructions: No follow-ups on file.  ? ?Orders:  ?No orders of the defined types were placed in this encounter. ? ?No orders of the defined types were placed in this encounter. ? ? ? ? Procedures: ?No procedures performed ? ? ?Clinical Data: ?No additional findings. ? ? ?Subjective: ?Chief Complaint  ?Patient presents with  ? Right Hand - New Patient (Initial Visit)  ? ? ?This is a 31 year old right-hand-dominant male who presents for ER follow-up.  On 04/05/2021 he was unloading metal scrap of a truck and sustained a laceration to the dorsal aspect of the right hand over the index, middle, and ring fingers at the MP joints.  The wounds were irrigated and closed in the ER.  He has no difficulty with finger extension.  He has sensation intact throughout the fingers.  He has no concerns today. ? ? ? ?Review of Systems ? ? ?Objective: ?Vital Signs: There were no vitals taken for this visit. ? ?Physical Exam ?Constitutional:   ?   Appearance: Normal appearance.  ?Cardiovascular:  ?   Rate and Rhythm: Normal rate.  ?   Pulses: Normal pulses.  ?Pulmonary:  ?   Effort: Pulmonary effort is normal.  ?Skin: ?   General: Skin is warm and dry.  ?   Capillary Refill: Capillary refill takes less than 2 seconds.  ?Neurological:  ?   Mental Status: He  is alert.  ? ? ?Right Hand Exam  ? ?Tenderness  ?The patient is experiencing no tenderness.  ? ?Other  ?Erythema: absent ?Sensation: normal ?Pulse: present ? ?Comments:  Transverse/curvilinear incisions on the dorsal aspect of the right index, middle, and ring fingers across the MP joints.  The incisions are well approximated without surrounding erythema or induration.  There are Prolene sutures in place.  He has 5-5 extension strength in these fingers.  Sensation intact light touch throughout. ? ? ? ? ?Specialty Comments:  ?No specialty comments available. ? ?Imaging: ?No results found. ? ? ?PMFS History: ?Patient Active Problem List  ? Diagnosis Date Noted  ? Laceration of multiple sites of right hand and fingers 04/14/2021  ? ?History reviewed. No pertinent past medical history.  ?History reviewed. No pertinent family history.  ?History reviewed. No pertinent surgical history. ?Social History  ? ?Occupational History  ? Not on file  ?Tobacco Use  ? Smoking status: Every Day  ? Smokeless tobacco: Never  ?Substance and Sexual Activity  ? Alcohol use: Yes  ? Drug use: Never  ? Sexual activity: Not on file  ? ? ? ? ? ? ?

## 2021-08-04 ENCOUNTER — Emergency Department (HOSPITAL_COMMUNITY)
Admission: EM | Admit: 2021-08-04 | Discharge: 2021-08-04 | Disposition: A | Discharge status: Home / Self Care | Payer: Self-pay | Attending: Emergency Medicine | Admitting: Emergency Medicine

## 2021-08-04 ENCOUNTER — Emergency Department (HOSPITAL_COMMUNITY): Payer: Self-pay

## 2021-08-04 ENCOUNTER — Encounter (HOSPITAL_COMMUNITY): Payer: Self-pay

## 2021-08-04 DIAGNOSIS — Y990 Civilian activity done for income or pay: Secondary | ICD-10-CM | POA: Insufficient documentation

## 2021-08-04 DIAGNOSIS — R61 Generalized hyperhidrosis: Secondary | ICD-10-CM | POA: Insufficient documentation

## 2021-08-04 DIAGNOSIS — S71111A Laceration without foreign body, right thigh, initial encounter: Secondary | ICD-10-CM | POA: Insufficient documentation

## 2021-08-04 DIAGNOSIS — R55 Syncope and collapse: Secondary | ICD-10-CM | POA: Insufficient documentation

## 2021-08-04 DIAGNOSIS — M795 Residual foreign body in soft tissue: Secondary | ICD-10-CM | POA: Insufficient documentation

## 2021-08-04 DIAGNOSIS — S81811A Laceration without foreign body, right lower leg, initial encounter: Secondary | ICD-10-CM

## 2021-08-04 DIAGNOSIS — R001 Bradycardia, unspecified: Secondary | ICD-10-CM | POA: Insufficient documentation

## 2021-08-04 DIAGNOSIS — W311XXA Contact with metalworking machines, initial encounter: Secondary | ICD-10-CM | POA: Insufficient documentation

## 2021-08-04 LAB — CBC WITH DIFFERENTIAL/PLATELET
Abs Immature Granulocytes: 0.02 10*3/uL (ref 0.00–0.07)
Basophils Absolute: 0 10*3/uL (ref 0.0–0.1)
Basophils Relative: 0 %
Eosinophils Absolute: 0.2 10*3/uL (ref 0.0–0.5)
Eosinophils Relative: 2 %
HCT: 42.6 % (ref 39.0–52.0)
Hemoglobin: 14 g/dL (ref 13.0–17.0)
Immature Granulocytes: 0 %
Lymphocytes Relative: 34 %
Lymphs Abs: 2.5 10*3/uL (ref 0.7–4.0)
MCH: 29.7 pg (ref 26.0–34.0)
MCHC: 32.9 g/dL (ref 30.0–36.0)
MCV: 90.3 fL (ref 80.0–100.0)
Monocytes Absolute: 0.6 10*3/uL (ref 0.1–1.0)
Monocytes Relative: 8 %
Neutro Abs: 4.1 10*3/uL (ref 1.7–7.7)
Neutrophils Relative %: 56 %
Platelets: 169 10*3/uL (ref 150–400)
RBC: 4.72 MIL/uL (ref 4.22–5.81)
RDW: 12.3 % (ref 11.5–15.5)
WBC: 7.4 10*3/uL (ref 4.0–10.5)
nRBC: 0 % (ref 0.0–0.2)

## 2021-08-04 LAB — BASIC METABOLIC PANEL
Anion gap: 5 (ref 5–15)
BUN: 12 mg/dL (ref 6–20)
CO2: 23 mmol/L (ref 22–32)
Calcium: 8.8 mg/dL — ABNORMAL LOW (ref 8.9–10.3)
Chloride: 110 mmol/L (ref 98–111)
Creatinine, Ser: 0.99 mg/dL (ref 0.61–1.24)
GFR, Estimated: >60 mL/min (ref >60)
Glucose, Bld: 119 mg/dL — ABNORMAL HIGH (ref 70–99)
Potassium: 4.4 mmol/L (ref 3.5–5.1)
Sodium: 138 mmol/L (ref 135–145)

## 2021-08-04 MED ORDER — TETANUS-DIPHTH-ACELL PERTUSSIS 5-2.5-18.5 LF-MCG/0.5 IM SUSY: 0.5000 mL | PREFILLED_SYRINGE | Freq: Once | INTRAMUSCULAR | Status: DC

## 2021-08-04 MED ORDER — LIDOCAINE-EPINEPHRINE (PF) 2 %-1:200000 IJ SOLN
10.0000 mL | Freq: Once | INTRAMUSCULAR | Status: AC
  Administered 2021-08-04: 10 mL
  Filled 2021-08-04: qty 20

## 2021-08-04 MED ORDER — CEPHALEXIN 500 MG PO CAPS: 500.0000 mg | ORAL_CAPSULE | Freq: Four times a day (QID) | ORAL | 0 refills | Status: AC

## 2021-08-04 MED ORDER — CEFAZOLIN SODIUM-DEXTROSE 2-4 GM/100ML-% IV SOLN
2.0000 g | Freq: Once | INTRAVENOUS | Status: AC
  Administered 2021-08-04: 2 g via INTRAVENOUS
  Filled 2021-08-04: qty 100

## 2021-08-04 MED ORDER — IBUPROFEN 600 MG PO TABS: 600.0000 mg | ORAL_TABLET | Freq: Four times a day (QID) | ORAL | 0 refills | Status: AC | PRN

## 2021-08-04 MED ORDER — SODIUM CHLORIDE 0.9 % IV BOLUS
1000.0000 mL | Freq: Once | INTRAVENOUS | Status: AC
  Administered 2021-08-04: 1000 mL via INTRAVENOUS

## 2021-08-04 NOTE — ED Provider Notes (Signed)
[facility] EMERGENCY DEPARTMENT Provider Note   CSN: [id number] Arrival date & time: 08/04/21  4818     History  Chief Complaint  Patient presents with   Leg Injury    Daniel Farmer is a 31 y.o. male.  Pt is a 31 yo male with no significant pmhx.  Pt was working today with a Writer and it kicked off something and lacerated his right thigh.  Pt was initially very diaphoretic and had a near syncopal event upon arrival. Pt denies ay numbness or difficulty moving the right leg.       Home Medications Prior to Admission medications   Medication Sig Start Date End Date Taking? Authorizing Provider  cephALEXin (KEFLEX) 500 MG capsule Take 1 capsule (500 mg total) by mouth 4 (four) times daily. 08/04/21  Yes Jacalyn Lefevre, MD  ibuprofen (ADVIL) 600 MG tablet Take 1 tablet (600 mg total) by mouth every 6 (six) hours as needed. 08/04/21  Yes Jacalyn Lefevre, MD  acetaminophen (TYLENOL) 325 MG tablet Take 2 tablets (650 mg total) by mouth every 6 (six) hours as needed for up to 30 doses. 04/05/21   Terald Sleeper, MD  traMADol (ULTRAM) 50 MG tablet Take 1 tablet (50 mg total) by mouth every 8 (eight) hours as needed for up to 10 doses. 04/05/21   Terald Sleeper, MD      Allergies    Patient has no known allergies.    Review of Systems   Review of Systems  Skin:  Positive for wound.  All other systems reviewed and are negative.   Physical Exam Updated Vital Signs BP 117/66   Pulse (!) 51   Temp 98 F (36.7 C) (Oral)   Resp 15   Ht 6' (1.829 m)   Wt 69.4 kg   SpO2 100%   BMI 20.75 kg/m  Physical Exam Vitals and nursing note reviewed.  Constitutional:      Appearance: Normal appearance.  HENT:     Head: Normocephalic and atraumatic.     Right Ear: External ear normal.     Left Ear: External ear normal.     Nose: Nose normal.     Mouth/Throat:     Mouth: Mucous membranes are moist.     Pharynx: Oropharynx is clear.  Eyes:     Extraocular  Movements: Extraocular movements intact.     Pupils: Pupils are equal, round, and reactive to light.  Cardiovascular:     Rate and Rhythm: Regular rhythm. Bradycardia present.     Pulses: Normal pulses.     Heart sounds: Normal heart sounds.  Pulmonary:     Effort: Pulmonary effort is normal.     Breath sounds: Normal breath sounds.  Abdominal:     General: Abdomen is flat. Bowel sounds are normal.     Palpations: Abdomen is soft.  Musculoskeletal:        General: Normal range of motion.     Cervical back: Normal range of motion and neck supple.  Skin:    General: Skin is warm.     Capillary Refill: Capillary refill takes less than 2 seconds.     Comments: Lac to right thigh  Neurological:     General: No focal deficit present.     Mental Status: He is alert and oriented to person, place, and time.  Psychiatric:        Mood and Affect: Mood normal.        Behavior: Behavior  normal.     ED Results / Procedures / Treatments   Labs (all labs ordered are listed, but only abnormal results are displayed) Labs Reviewed  BASIC METABOLIC PANEL - Abnormal; Notable for the following components:      Result Value   Glucose, Bld 119 (*)    Calcium 8.8 (*)    All other components within normal limits  CBC WITH DIFFERENTIAL/PLATELET    EKG EKG Interpretation  Date/Time:  Friday August 04 2021 09:37:08 EDT Ventricular Rate:  40 PR Interval:  164 QRS Duration: 116 QT Interval:  452 QTC Calculation: 369 R Axis:   92 Text Interpretation: Sinus bradycardia Incomplete right bundle branch block Inferior infarct, acute ST elevation, consider anterior injury Since last tracing rate slower Confirmed by Jacalyn Lefevre [phone]) on 08/04/2021 11:05:23 AM  Radiology DG Femur Min 2 Views Right  Result Date: 08/04/2021 CLINICAL DATA:  Patient reports he had his right inner upper leg caught in metal grinder. EXAM: RIGHT FEMUR 2 VIEWS COMPARISON:  None Available. FINDINGS: There is no evidence of  fracture or other focal bone lesions. There is subcutaneous emphysema and multiple punctate radiopaque foreign bodies on the medial aspect of the mid to lower right thigh. IMPRESSION: 1. No acute osseous abnormality. 2. Subcutaneous emphysema and multiple punctate radiopaque foreign bodies on the medial aspect of the mid to lower thigh. Electronically Signed   By: Larose Farmer D.O.   On: 08/04/2021 11:18    Procedures .Marland KitchenLaceration Repair  Date/Time: 08/04/2021 11:02 AM  Performed by: Jacalyn Lefevre, MD Authorized by: Jacalyn Lefevre, MD   Consent:    Consent obtained:  Verbal   Consent given by:  Patient   Risks, benefits, and alternatives were discussed: yes   Universal protocol:    Procedure explained and questions answered to patient or proxy's satisfaction: yes     Patient identity confirmed:  Verbally with patient Anesthesia:    Anesthesia method:  Local infiltration   Local anesthetic:  Lidocaine 2% WITH epi Laceration details:    Location:  Leg   Leg location:  R upper leg   Length (cm):  7 Pre-procedure details:    Preparation:  Patient was prepped and draped in usual sterile fashion and imaging obtained to evaluate for foreign bodies Exploration:    Limited defect created (wound extended): yes     Hemostasis achieved with:  Epinephrine   Imaging obtained: x-ray     Imaging outcome: foreign body noted     Wound exploration: wound explored through full range of motion and entire depth of wound visualized     Contaminated: yes   Treatment:    Area cleansed with:  Saline   Amount of cleaning:  Extensive   Irrigation solution:  Sterile saline   Irrigation volume:  500 cc   Irrigation method:  Pressure wash   Visualized foreign bodies/material removed: yes     Debridement:  None   Undermining:  None Skin repair:    Repair method:  Sutures   Suture size:  4-0   Suture material:  Nylon   Suture technique:  Simple interrupted   Number of sutures:  8 Approximation:     Approximation:  Close Repair type:    Repair type:  Intermediate Post-procedure details:    Dressing:  Antibiotic ointment and non-adherent dressing   Procedure completion:  Tolerated well, no immediate complications Comments:     A large piece of the cutting wheel removed from wound.  This was removed prior  to x-ray.     Medications Ordered in ED Medications  Tdap (BOOSTRIX) injection 0.5 mL (0.5 mLs Intramuscular Patient Refused/Not Given 08/04/21 1015)  ceFAZolin (ANCEF) IVPB 2g/100 mL premix (has no administration in time range)  sodium chloride 0.9 % bolus 1,000 mL (0 mLs Intravenous Stopped 08/04/21 1102)  lidocaine-EPINEPHrine (XYLOCAINE W/EPI) 2 %-1:200000 (PF) injection 10 mL (10 mLs Infiltration Given by Other 08/04/21 1015)    ED Course/ Medical Decision Making/ A&P                           Medical Decision Making Amount and/or Complexity of Data Reviewed Labs: ordered. Radiology: ordered.  Risk Prescription drug management.   This patient presents to the ED for concern of lac to right thigh/syncope, this involves an extensive number of treatment options, and is a complaint that carries with it a high risk of complications and morbidity.  The differential diagnosis includes deep injury vs superficial, vasogenic vs cardiogenic syncope   Co morbidities that complicate the patient evaluation  none   Additional history obtained:  Additional history obtained from epic chart review   Lab Tests:  I Ordered, and personally interpreted labs.  The pertinent results include:  cbc nl, bmp nl   Imaging Studies ordered:  I ordered imaging studies including right thigh  I independently visualized and interpreted imaging which showed  IMPRESSION:  1. No acute osseous abnormality.  2. Subcutaneous emphysema and multiple punctate radiopaque foreign  bodies on the medial aspect of the mid to lower thigh.   I agree with the radiologist interpretation   Cardiac  Monitoring:  The patient was maintained on a cardiac monitor.  I personally viewed and interpreted the cardiac monitored which showed an underlying rhythm of: sinus brady   Medicines ordered and prescription drug management:  I ordered medication including ancef  for prevent infection  Reevaluation of the patient after these medicines showed that the patient improved I have reviewed the patients home medicines and have made adjustments as needed  Critical Interventions:  Lac repair    Problem List / ED Course:  Syncope:  likely vasogenic due to wound.  He feels much better now.  He is able to ambulate. Thigh lac:  no evidence of deep injury.  Wound did not invade the fascia.  FB removed.  Pt still has several very small flecks of metal in wound that I was unable to remove with irrigation and with gauze.  Pt is aware.  He is given abx here and is d/c with abx.  He is to return for increased redness and swelling.   Reevaluation:  After the interventions noted above, I reevaluated the patient and found that they have :improved   Social Determinants of Health:  Lives at home, no insurance, no pcp   Dispostion:  After consideration of the diagnostic results and the patients response to treatment, I feel that the patent would benefit from discharge with outpatient f/u.          Final Clinical Impression(s) / ED Diagnoses Final diagnoses:  Laceration of right lower extremity, initial encounter  Foreign body (FB) in soft tissue    Rx / DC Orders ED Discharge Orders          Ordered    cephALEXin (KEFLEX) 500 MG capsule  4 times daily        08/04/21 1127    ibuprofen (ADVIL) 600 MG tablet  Every 6 hours PRN  08/04/21 1127              Jacalyn Lefevre, MD 08/04/21 1128

## 2021-08-04 NOTE — Discharge Instructions (Addendum)
Sutures need to be removed in 10 days.  Return if you have increased redness or swelling.

## 2021-08-04 NOTE — ED Triage Notes (Signed)
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 Pt lacerated right thigh. Arrived by car. Pt fell out of car per trauma RN who assisted pt to ED. Pt was diaphoretic but alert and oriented x 4.

## 2021-08-14 ENCOUNTER — Encounter (HOSPITAL_COMMUNITY): Payer: Self-pay

## 2021-08-14 ENCOUNTER — Other Ambulatory Visit: Payer: Self-pay

## 2021-08-14 ENCOUNTER — Emergency Department (HOSPITAL_COMMUNITY)
Admission: EM | Admit: 2021-08-14 | Discharge: 2021-08-14 | Disposition: A | Discharge status: Home / Self Care | Payer: Self-pay | Attending: Student | Admitting: Student

## 2021-08-14 DIAGNOSIS — Z4802 Encounter for removal of sutures: Secondary | ICD-10-CM

## 2021-08-14 NOTE — ED Provider Notes (Signed)
[facility] EMERGENCY DEPARTMENT Provider Note   CSN: [id number] Arrival date & time: 08/14/21  2876     History  Chief Complaint  Patient presents with   Suture / Staple Removal    Daniel Farmer is a 31 y.o. male.  With no significant past medical history presents to the emergency department for suture removal.  Patient states that he was seen on 08/04/2021 after having an laceration to his right thigh from a metal grinder.  He did have several small flecks of metal in the wound that was unable to be removed.  He had 8 sutures placed.  He denies any significant swelling, fevers, purulent drainage.  He has had some redness around the wound but states that this has been here since he had his sutures placed.  He states he is intermittently taking his antibiotics.   Suture / Staple Removal       Home Medications Prior to Admission medications   Medication Sig Start Date End Date Taking? Authorizing Provider  acetaminophen (TYLENOL) 325 MG tablet Take 2 tablets (650 mg total) by mouth every 6 (six) hours as needed for up to 30 doses. 04/05/21   Terald Sleeper, MD  cephALEXin (KEFLEX) 500 MG capsule Take 1 capsule (500 mg total) by mouth 4 (four) times daily. 08/04/21   Jacalyn Lefevre, MD  ibuprofen (ADVIL) 600 MG tablet Take 1 tablet (600 mg total) by mouth every 6 (six) hours as needed. 08/04/21   Jacalyn Lefevre, MD  traMADol (ULTRAM) 50 MG tablet Take 1 tablet (50 mg total) by mouth every 8 (eight) hours as needed for up to 10 doses. 04/05/21   Terald Sleeper, MD      Allergies    Patient has no known allergies.    Review of Systems   Review of Systems  Skin:  Positive for wound.  All other systems reviewed and are negative.   Physical Exam Updated Vital Signs BP 120/60   Pulse (!) 48   Temp 98.7 F (37.1 C) (Oral)   Resp 17   Ht 6' (1.829 m)   Wt 70.3 kg   SpO2 98%   BMI 21.02 kg/m  Physical Exam Vitals and nursing note reviewed.  HENT:      Head: Normocephalic and atraumatic.  Eyes:     General: No scleral icterus. Pulmonary:     Effort: Pulmonary effort is normal. No respiratory distress.  Musculoskeletal:        General: Tenderness and signs of injury present. No swelling. Normal range of motion.  Skin:    General: Skin is warm and dry.     Capillary Refill: Capillary refill takes less than 2 seconds.     Findings: Erythema present. No rash.     Comments: Closed the laceration to the right upper inner thigh.  It is scabbed over.  There is some mild erythema surrounding the wound but no purulent drainage, swelling or fluctuance.  Wound appears approximated and closed  Neurological:     General: No focal deficit present.     Mental Status: He is alert and oriented to person, place, and time. Mental status is at baseline.  Psychiatric:        Mood and Affect: Mood normal.        Behavior: Behavior normal.        Thought Content: Thought content normal.        Judgment: Judgment normal.     ED Results / Procedures /  Treatments   Labs (all labs ordered are listed, but only abnormal results are displayed) Labs Reviewed - No data to display  EKG None  Radiology No results found.  Procedures .Suture Removal  Date/Time: 08/14/2021 9:56 AM  Performed by: Cristopher Peru, PA-C Authorized by: Cristopher Peru, PA-C   Consent:    Consent obtained:  Verbal   Consent given by:  Patient   Risks, benefits, and alternatives were discussed: yes     Risks discussed:  Bleeding, pain and wound separation   Alternatives discussed:  No treatment and delayed treatment Universal protocol:    Procedure explained and questions answered to patient or proxy's satisfaction: yes     Relevant documents present and verified: yes     Test results available: yes     Imaging studies available: yes     Required blood products, implants, devices, and special equipment available: yes     Site/side marked: yes     Immediately prior to  procedure, a time out was called: yes     Patient identity confirmed:  Verbally with patient Location:    Location:  Lower extremity   Lower extremity location:  Leg   Leg location:  R upper leg Procedure details:    Wound appearance:  No signs of infection, good wound healing and clean   Number of sutures removed:  8 Post-procedure details:    Post-removal:  No dressing applied   Procedure completion:  Tolerated well, no immediate complications   Medications Ordered in ED Medications - No data to display  ED Course/ Medical Decision Making/ A&P                           Medical Decision Making This patient presents to the ED with chief complaint(s) of suture removal with pertinent past medical history of laceration which further complicates the presenting complaint. The complaint involves an extensive differential diagnosis and also carries with it a high risk of complications and morbidity.    The differential diagnosis includes good wound healing versus infection  Additional history obtained: Additional history obtained from  none available Records reviewed  ED note from 08/04/2021  ED Course and Reassessment: 31 year old male who presents to the emergency department for suture removal.  I have evaluated the wound and it does appear to be well-healing.  He does have some surrounding redness but states this has been here since the sutures were placed.  No evidence of infection.  I removed 8 sutures.  Given instructions for post suture removal wound care.  Given return precautions for wound separation or signs of infection.  Instructed to please take antibiotics as prescribed.  Verbalized understanding  Independent labs interpretation:  The following labs were independently interpreted: N/A  Independent visualization of imaging: - I independently visualized the following imaging with scope of interpretation limited to determining acute life threatening conditions related to  emergency care: N/A, which revealed N/A   Consultation: - Consulted or discussed management/test interpretation w/ external professional: N/A  Consideration for admission or further workup: N/A Social Determinants of health: N/A Final Clinical Impression(s) / ED Diagnoses Final diagnoses:  Visit for suture removal    Rx / DC Orders ED Discharge Orders     None         Cristopher Peru, PA-C 08/14/21 0958    Mardene Sayer, MD 08/14/21 1029

## 2021-08-14 NOTE — Discharge Instructions (Addendum)
You were seen in the emergency department for suture removal. You may use some neosporin or bacitracin on the wound. Please keep it clean. Please return if the wound re-opens or has foul drainage or fevers.

## 2021-08-14 NOTE — ED Triage Notes (Signed)
Pt requesting suture removal on right thigh, placed 11 days ago. Redness noted around sutures, denies any drainage.

## 2023-08-18 ENCOUNTER — Other Ambulatory Visit: Payer: Self-pay

## 2023-08-18 ENCOUNTER — Emergency Department (HOSPITAL_COMMUNITY)
Admission: EM | Admit: 2023-08-18 | Discharge: 2023-08-18 | Disposition: A | Discharge status: Home / Self Care | Payer: Self-pay | Attending: Emergency Medicine | Admitting: Emergency Medicine

## 2023-08-18 ENCOUNTER — Encounter (HOSPITAL_COMMUNITY): Payer: Self-pay

## 2023-08-18 DIAGNOSIS — W228XXA Striking against or struck by other objects, initial encounter: Secondary | ICD-10-CM | POA: Insufficient documentation

## 2023-08-18 DIAGNOSIS — S0501XA Injury of conjunctiva and corneal abrasion without foreign body, right eye, initial encounter: Secondary | ICD-10-CM | POA: Insufficient documentation

## 2023-08-18 DIAGNOSIS — Y93H2 Activity, gardening and landscaping: Secondary | ICD-10-CM | POA: Insufficient documentation

## 2023-08-18 MED ORDER — ERYTHROMYCIN 5 MG/GM OP OINT
1.0000 | TOPICAL_OINTMENT | Freq: Once | OPHTHALMIC | Status: AC
  Administered 2023-08-18: 1 via OPHTHALMIC
  Filled 2023-08-18: qty 3.5

## 2023-08-18 MED ORDER — ERYTHROMYCIN 5 MG/GM OP OINT: TOPICAL_OINTMENT | OPHTHALMIC | 0 refills | Status: AC

## 2023-08-18 MED ORDER — ERYTHROMYCIN 5 MG/GM OP OINT
TOPICAL_OINTMENT | OPHTHALMIC | 0 refills | Status: DC
Start: 2023-08-18 — End: 2023-08-18

## 2023-08-18 MED ORDER — FLUORESCEIN SODIUM 1 MG OP STRP
1.0000 | ORAL_STRIP | Freq: Once | OPHTHALMIC | Status: AC
  Administered 2023-08-18: 1 via OPHTHALMIC
  Filled 2023-08-18: qty 1

## 2023-08-18 MED ORDER — TETRACAINE HCL 0.5 % OP SOLN
2.0000 [drp] | Freq: Once | OPHTHALMIC | Status: AC
  Administered 2023-08-18: 2 [drp] via OPHTHALMIC
  Filled 2023-08-18: qty 4

## 2023-08-18 MED ORDER — ACETAMINOPHEN 500 MG PO TABS
1000.0000 mg | ORAL_TABLET | Freq: Once | ORAL | Status: AC
  Administered 2023-08-18: 1000 mg via ORAL
  Filled 2023-08-18: qty 2

## 2023-08-18 NOTE — ED Provider Notes (Signed)
 [place] EMERGENCY DEPARTMENT AT [facility] Provider Note   CSN: [id number] Arrival date & time: 08/18/23  0840     Patient presents with: Eye Injury   Daniel Farmer is a 33 y.o. male with noncontributory past medical history who presents to ED concern for right eye pain after a pebble hit his eye while mowing lawn yesterday.  Patient endorsing foreign body sensation.  Patient also with copious clear/watery discharge from right eye which is blurring of vision.  Patient did extensively rinsed his eye out last night without significant relief.  Patient does not wear contacts.  Patient denies any other complaint today.    Eye Injury       Prior to Admission medications   Medication Sig Start Date End Date Taking? Authorizing Provider  acetaminophen  (TYLENOL ) 325 MG tablet Take 2 tablets (650 mg total) by mouth every 6 (six) hours as needed for up to 30 doses. 04/05/21   Cottie Donnice PARAS, MD  cephALEXin  (KEFLEX ) 500 MG capsule Take 1 capsule (500 mg total) by mouth 4 (four) times daily. 08/04/21   Dean Clarity, MD  ibuprofen  (ADVIL ) 600 MG tablet Take 1 tablet (600 mg total) by mouth every 6 (six) hours as needed. 08/04/21   Dean Clarity, MD  traMADol  (ULTRAM ) 50 MG tablet Take 1 tablet (50 mg total) by mouth every 8 (eight) hours as needed for up to 10 doses. 04/05/21   Cottie Donnice PARAS, MD    Allergies: Patient has no known allergies.    Review of Systems  Eyes:  Positive for pain.    Updated Vital Signs BP 121/79 (BP Location: Right Arm)   Pulse (!) 58   Temp (!) 97.5 F (36.4 C) (Oral)   Resp 14   Ht 6' 1 (1.854 m)   Wt 70.3 kg   SpO2 100%   BMI 20.45 kg/m   Physical Exam Vitals and nursing note reviewed.  Constitutional:      General: He is not in acute distress.    Appearance: He is not ill-appearing or toxic-appearing.  HENT:     Head: Normocephalic and atraumatic.  Eyes:     General: No scleral icterus.       Left eye: No discharge.      Extraocular Movements: Extraocular movements intact.     Pupils: Pupils are equal, round, and reactive to light.     Comments: Right eye: Copious watery discharge.  Mild conjunctival injection.  Fluorescein  stain with pinpoint uptake in cornea just medial to pupil. PERRL. EOM intact.  Eyelids inverted without concern for foreign body.  IOP 19.  Cardiovascular:     Rate and Rhythm: Normal rate.  Pulmonary:     Effort: Pulmonary effort is normal.  Abdominal:     General: Abdomen is flat.  Skin:    General: Skin is warm and dry.  Neurological:     General: No focal deficit present.     Mental Status: He is alert. Mental status is at baseline.  Psychiatric:        Mood and Affect: Mood normal.        Behavior: Behavior normal.     (all labs ordered are listed, but only abnormal results are displayed) Labs Reviewed - No data to display  EKG: None  Radiology: No results found.   Procedures   Medications Ordered in the ED  tetracaine  (PONTOCAINE) 0.5 % ophthalmic solution 2 drop (has no administration in time range)  fluorescein  ophthalmic strip 1  strip (has no administration in time range)  erythromycin  ophthalmic ointment 1 Application (has no administration in time range)  acetaminophen  (TYLENOL ) tablet 1,000 mg (has no administration in time range)                                    Medical Decision Making Risk Prescription drug management.   This patient presents to the ED for concern of eye pain, this involves an extensive number of treatment options, and is a complaint that carries with it a high risk of complications and morbidity.  The differential diagnosis includes blepharitis, viral/bacterial conjunctivitis, corneal abrasion, dry eye syndrome, subconjunctival hemorrhage, acute angle-closure glaucoma, iritis, keratitis, scleritis   Co morbidities that complicate the patient evaluation  none   Additional history obtained:  No PCP listed in  chart   Problem List / ED Course / Critical interventions / Medication management  Patient presents to ED concern for foreign body sensation after a pebble hit his eye yesterday.  Physical exam with a very tiny/pinpoint corneal abrasion.  Rest of physical exam reassuring.  Patient afebrile with stable vitals. Patient will be prescribed antibiotic ointment and encouraged to follow-up with ophthalmology.  Patient verbalized understanding of plan. I have reviewed the patients home medicines and have made adjustments as needed The patient has been appropriately medically screened and/or stabilized in the ED. I have low suspicion for any other emergent medical condition which would require further screening, evaluation or treatment in the ED or require inpatient management. At time of discharge the patient is hemodynamically stable and in no acute distress. I have discussed work-up results and diagnosis with patient and answered all questions. Patient is agreeable with discharge plan. We discussed strict return precautions for returning to the emergency department and they verbalized understanding.     Social Determinants of Health:  none       Final diagnoses:  Abrasion of right cornea, initial encounter    ED Discharge Orders     None          Hoy Nidia FALCON, [place] 08/18/23 9071    Laurice Maude BROCKS, MD 08/18/23 [phone]

## 2023-08-18 NOTE — Discharge Instructions (Addendum)
 As discussed, you will need to follow-up with ophthalmologist Dr. McCuen.  Please also follow-up with primary care.  Seek emergency care if experiencing any new or worsening symptoms.  Alternating between 650 mg Tylenol  and 400 mg Advil : The best way to alternate taking Acetaminophen  (example Tylenol ) and Ibuprofen  (example Advil /Motrin ) is to take them 3 hours apart. For example, if you take ibuprofen  at 6 am you can then take Tylenol  at 9 am. You can continue this regimen throughout the day, making sure you do not exceed the recommended maximum dose for each drug.

## 2023-08-18 NOTE — ED Triage Notes (Signed)
 Pt states he was weedeating yesterday and a small rock hit his right eye. Pt states he feels like there is something still in there, has tearing, pain and blurred vision in right eye.

## 2023-11-24 IMAGING — DX DG HAND 2V*R*
2 series · 2 of 2 positions shown · non-contrast
Comparison: None.

CLINICAL DATA: Laceration, concern for foreign body

EXAM:
RIGHT HAND - 2 VIEW

[x hand pa right]
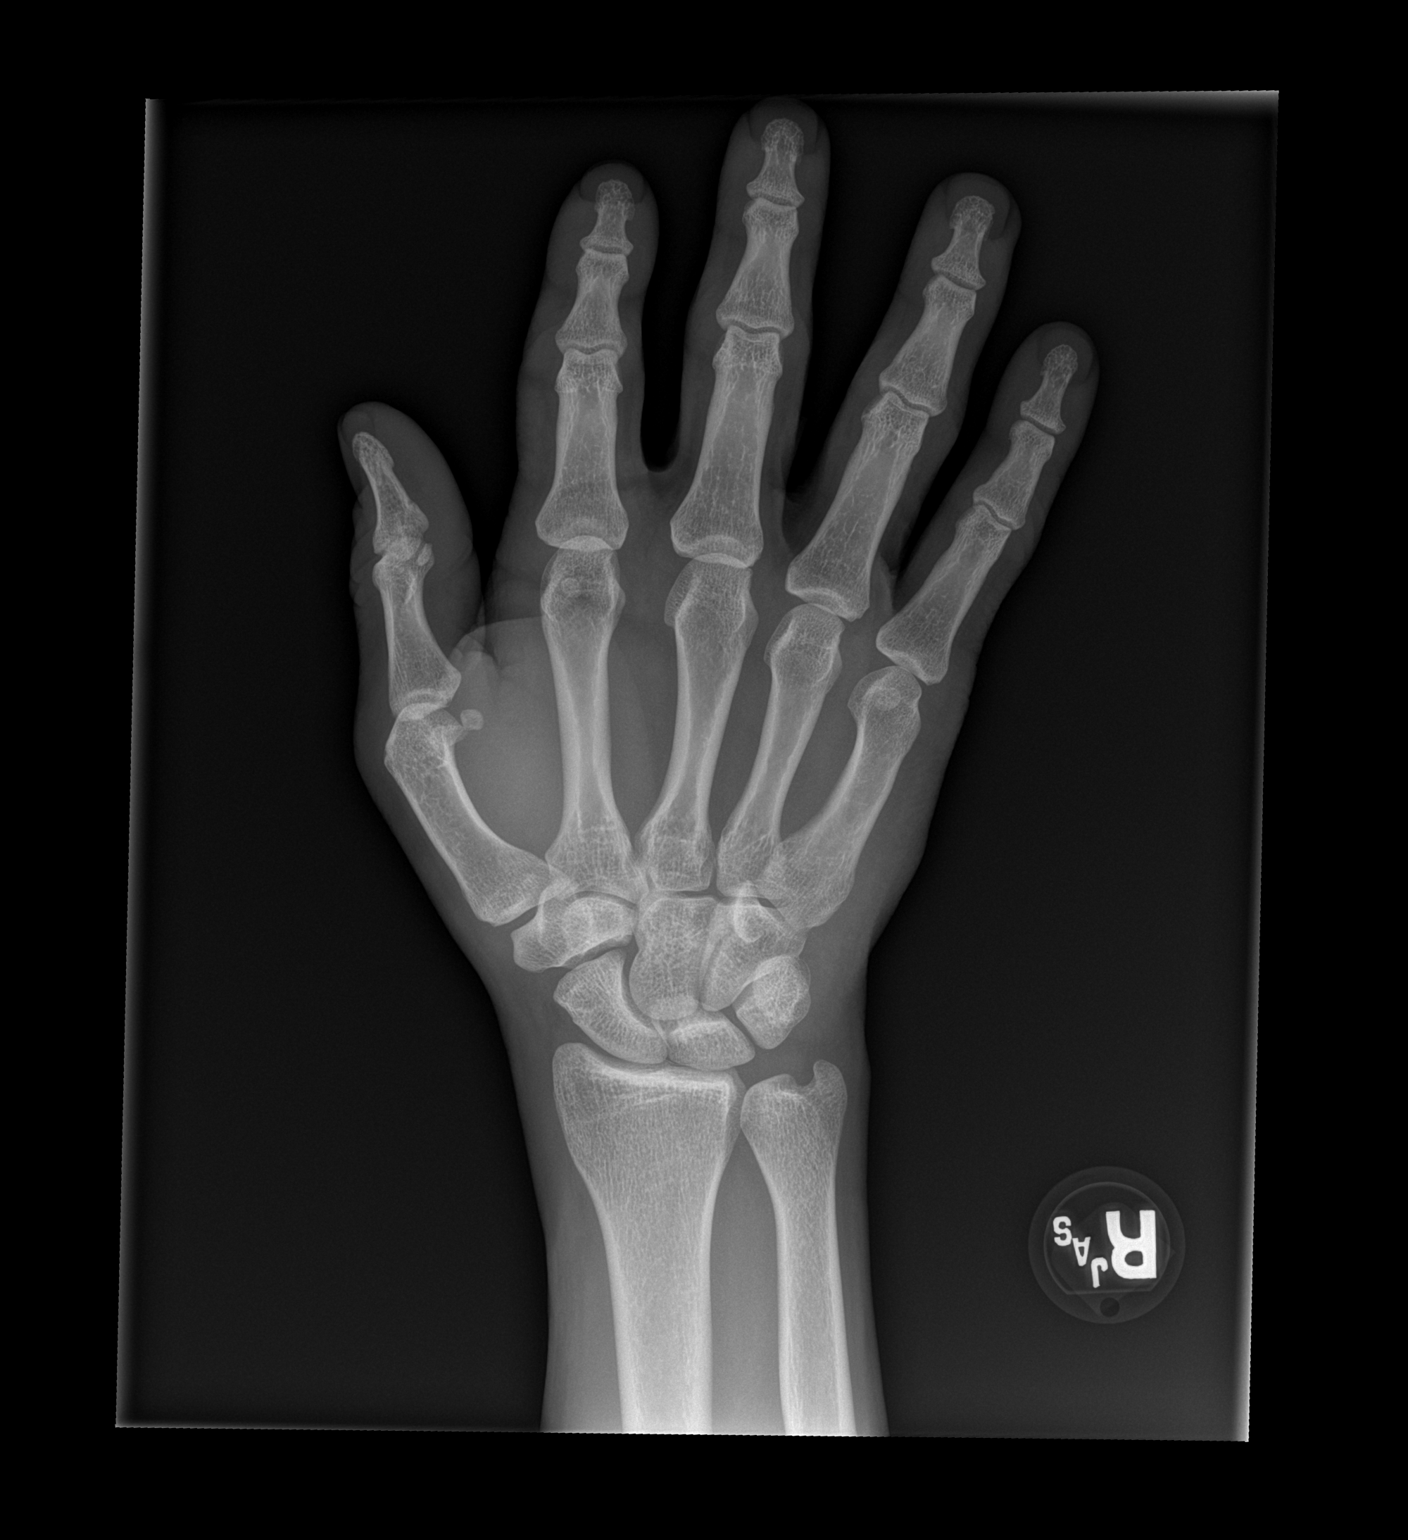

[x hand lat right]
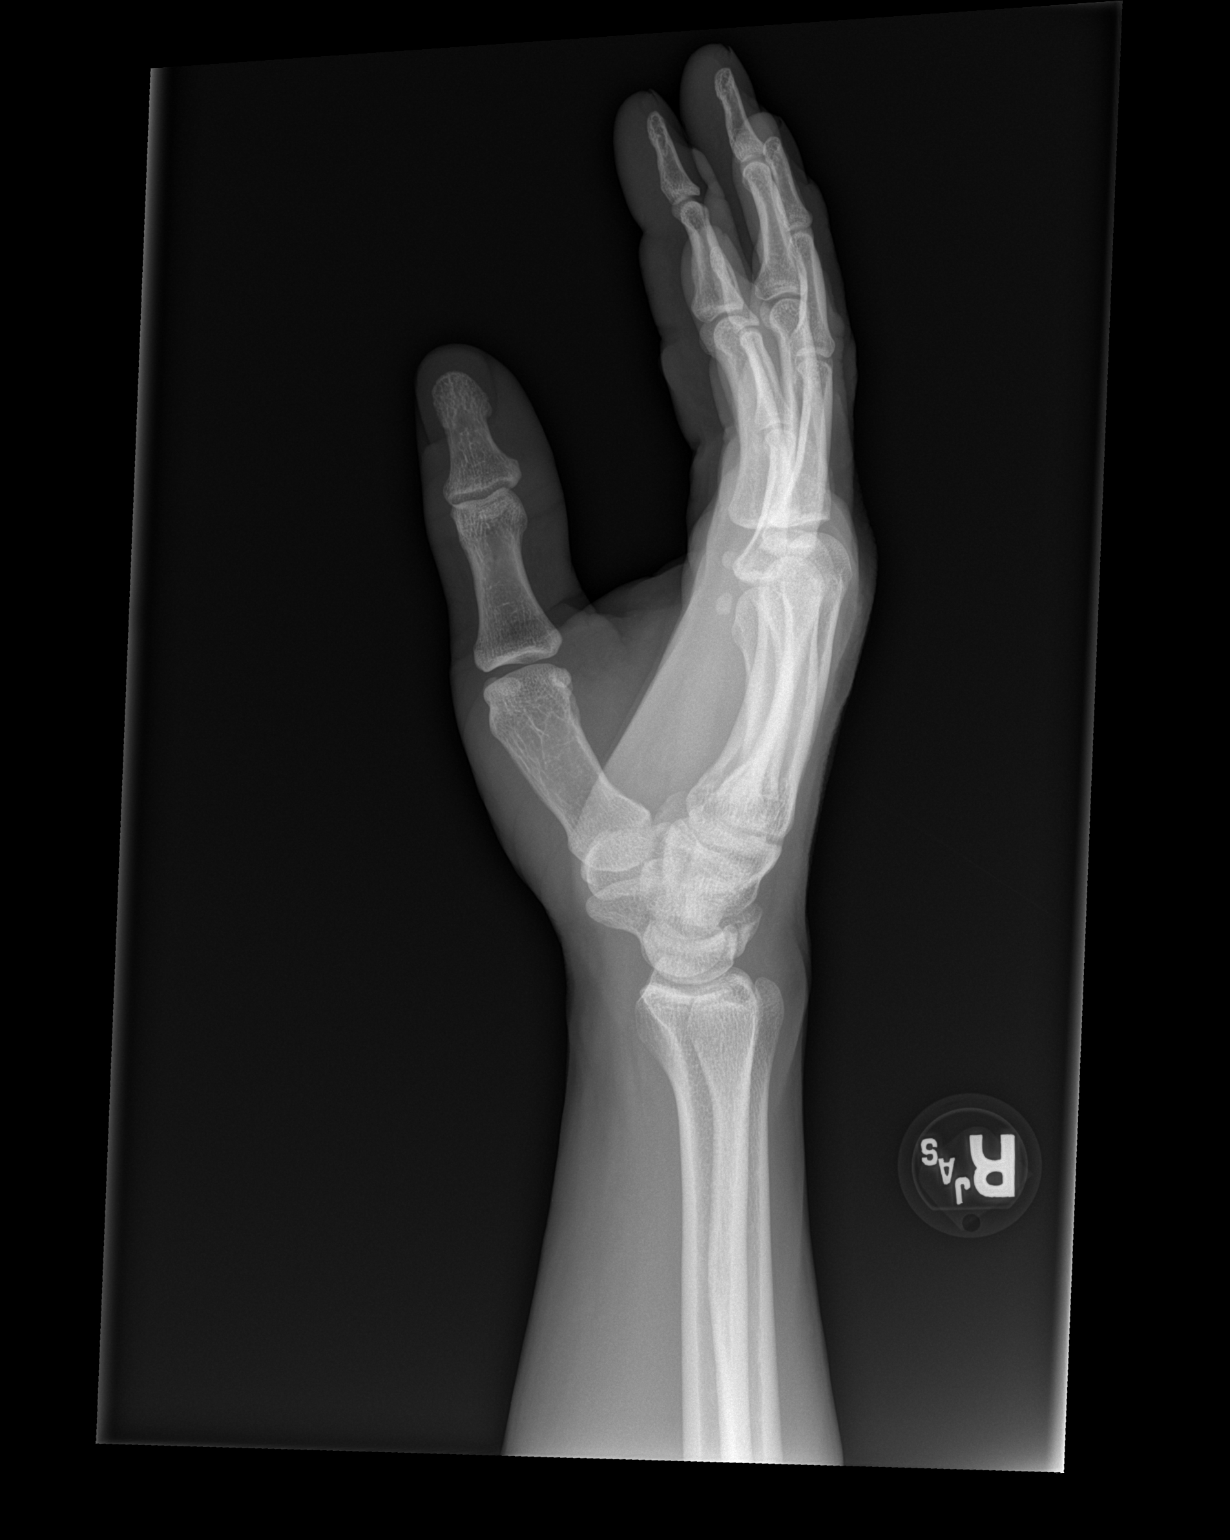

[2 of 2 positions shown; findings below may reference images not displayed]

FINDINGS: No acute fracture or dislocation. The joint spaces are preserved.
Alignment is unremarkable. No radiopaque foreign body.
IMPRESSION: No acute osseous abnormality.  No radiopaque foreign body.
